# Patient Record
Sex: Male | Born: 2000 | Hispanic: No | Marital: Single | Smoking: Never smoker
Health system: Southern US, Community
[De-identification: ages and names within clinical notes are randomized; demographics above are authoritative.]

## PROBLEM LIST (undated history)

## (undated) DIAGNOSIS — J45909 Unspecified asthma, uncomplicated: Secondary | ICD-10-CM

## (undated) DIAGNOSIS — K9041 Non-celiac gluten sensitivity: Secondary | ICD-10-CM

## (undated) DIAGNOSIS — E079 Disorder of thyroid, unspecified: Secondary | ICD-10-CM

## (undated) DIAGNOSIS — J302 Other seasonal allergic rhinitis: Secondary | ICD-10-CM

## (undated) DIAGNOSIS — T7840XA Allergy, unspecified, initial encounter: Secondary | ICD-10-CM

## (undated) DIAGNOSIS — E063 Autoimmune thyroiditis: Secondary | ICD-10-CM

## (undated) HISTORY — DX: Non-celiac gluten sensitivity: K90.41

## (undated) HISTORY — DX: Allergy, unspecified, initial encounter: T78.40XA

## (undated) HISTORY — DX: Disorder of thyroid, unspecified: E07.9

## (undated) HISTORY — DX: Other seasonal allergic rhinitis: J30.2

## (undated) HISTORY — DX: Autoimmune thyroiditis: E06.3

## (undated) HISTORY — DX: Unspecified asthma, uncomplicated: J45.909

---
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
## 2021-01-30 ENCOUNTER — Encounter: Payer: Self-pay | Admitting: Family Medicine

## 2021-01-30 ENCOUNTER — Telehealth (INDEPENDENT_AMBULATORY_CARE_PROVIDER_SITE_OTHER): Payer: 59 | Admitting: Family Medicine

## 2021-01-30 VITALS — Wt 180.0 lb

## 2021-01-30 DIAGNOSIS — B349 Viral infection, unspecified: Secondary | ICD-10-CM | POA: Diagnosis not present

## 2021-01-30 NOTE — Progress Notes (Signed)
   Subjective:    Patient ID: Alec Montes, male    DOB: 10-30-2001, 20 y.o.   MRN: [id number]  HPI Virtual Visit via Telephone Note  I connected with the patient on 01/30/21 at 10:00 AM EDT by telephone and verified that I am speaking with the correct person using two identifiers.   I discussed the limitations, risks, security and privacy concerns of performing an evaluation and management service by telephone and the availability of in person appointments. I also discussed with the patient that there Montes be a patient responsible charge related to this service. The patient expressed understanding and agreed to proceed.  Location patient: home Location provider: work or home office Participants present for the call: patient, provider Patient did not have a visit in the prior 7 days to address this/these issue(s).   History of Present Illness: Here for 3 days of body aches, headache, ST, and a dry cough. No fever or NVD. No loss of taste or smell. He tested negative for the Covid virus yesterday. He is drinking fluids and taking Tylenol Cold and Flu along with Ibuprofen. He feels better today than yesterday.    Observations/Objective: Patient sounds cheerful and well on the phone. I do not appreciate any SOB. Speech and thought processing are grossly intact. Patient reported vitals:  Assessment and Plan: Viral illness. He will continue the OTC measures he has been using. Recheck as needed.  Gershon Crane, MD   Follow Up Instructions:     [phone] [phone] 11-20 [id number] 21-30 I did not refer this patient for an OV in the next 24 hours for this/these issue(s).  I discussed the assessment and treatment plan with the patient. The patient was provided an opportunity to ask questions and all were answered. The patient agreed with the plan and demonstrated an understanding of the instructions.   The patient was advised to call back or seek an in-person evaluation if the symptoms worsen or if  the condition fails to improve as anticipated.  I provided 10 minutes of non-face-to-face time during this encounter.   Gershon Crane, MD    Review of Systems     Objective:   Physical Exam        Assessment & Plan:

## 2021-06-02 ENCOUNTER — Encounter: Payer: 59 | Admitting: Family Medicine

## 2021-06-14 ENCOUNTER — Other Ambulatory Visit: Payer: Self-pay

## 2021-06-14 ENCOUNTER — Encounter: Payer: Self-pay | Admitting: Family Medicine

## 2021-06-14 ENCOUNTER — Ambulatory Visit (INDEPENDENT_AMBULATORY_CARE_PROVIDER_SITE_OTHER): Payer: 59 | Admitting: Family Medicine

## 2021-06-14 VITALS — BP 128/60 | HR 67 | Temp 98.3°F | Ht 72.3 in | Wt 185.5 lb

## 2021-06-14 DIAGNOSIS — R5383 Other fatigue: Secondary | ICD-10-CM | POA: Diagnosis not present

## 2021-06-14 DIAGNOSIS — Z0000 Encounter for general adult medical examination without abnormal findings: Secondary | ICD-10-CM

## 2021-06-14 LAB — CBC WITH DIFFERENTIAL/PLATELET
Basophils Absolute: 0.1 10*3/uL (ref 0.0–0.1)
Basophils Relative: 0.6 % (ref 0.0–3.0)
Eosinophils Absolute: 0.1 10*3/uL (ref 0.0–0.7)
Eosinophils Relative: 1.1 % (ref 0.0–5.0)
HCT: 42.7 % (ref 36.0–49.0)
Hemoglobin: 15 g/dL (ref 12.0–16.0)
Lymphocytes Relative: 31 % (ref 24.0–48.0)
Lymphs Abs: 2.8 10*3/uL (ref 0.7–4.0)
MCHC: 35.1 g/dL (ref 31.0–37.0)
MCV: 83.7 fl (ref 78.0–98.0)
Monocytes Absolute: 0.8 10*3/uL (ref 0.1–1.0)
Monocytes Relative: 9 % (ref 3.0–12.0)
Neutro Abs: 5.3 10*3/uL (ref 1.4–7.7)
Neutrophils Relative %: 58.3 % (ref 43.0–71.0)
Platelets: 323 10*3/uL (ref 150.0–575.0)
RBC: 5.1 Mil/uL (ref 3.80–5.70)
RDW: 12.3 % (ref 11.4–15.5)
WBC: 9.1 10*3/uL (ref 4.5–13.5)

## 2021-06-14 LAB — HEPATIC FUNCTION PANEL
ALT: 19 U/L (ref 0–53)
AST: 14 U/L (ref 0–37)
Albumin: 4.4 g/dL (ref 3.5–5.2)
Alkaline Phosphatase: 60 U/L (ref 52–171)
Bilirubin, Direct: 0.1 mg/dL (ref 0.0–0.3)
Total Bilirubin: 0.5 mg/dL (ref 0.2–1.2)
Total Protein: 7.6 g/dL (ref 6.0–8.3)

## 2021-06-14 LAB — BASIC METABOLIC PANEL
BUN: 14 mg/dL (ref 6–23)
CO2: 29 mEq/L (ref 19–32)
Calcium: 9.9 mg/dL (ref 8.4–10.5)
Chloride: 99 mEq/L (ref 96–112)
Creatinine, Ser: 0.77 mg/dL (ref 0.40–1.50)
GFR: 129.71 mL/min (ref >60.00)
Glucose, Bld: 83 mg/dL (ref 70–99)
Potassium: 4.3 mEq/L (ref 3.5–5.1)
Sodium: 138 mEq/L (ref 135–145)

## 2021-06-14 LAB — LIPID PANEL
Cholesterol: 119 mg/dL (ref 0–200)
HDL: 34 mg/dL — ABNORMAL LOW (ref >39.00)
LDL Cholesterol: 69 mg/dL (ref 0–99)
NonHDL: 85.26
Total CHOL/HDL Ratio: 4
Triglycerides: 80 mg/dL (ref 0.0–149.0)
VLDL: 16 mg/dL (ref 0.0–40.0)

## 2021-06-14 LAB — TESTOSTERONE: Testosterone: 563.13 ng/dL (ref 200.00–970.00)

## 2021-06-14 LAB — TSH: TSH: 2.38 u[IU]/mL (ref 0.40–5.00)

## 2021-06-14 NOTE — Progress Notes (Signed)
Established Patient Office Visit  Subjective:  Patient ID: Alec Montes, male    DOB: [birth year]  Age: 20 y.o. MRN: [id number]  CC:  Chief Complaint  Patient presents with   Annual Exam    No new concerns     HPI [name] presents for physical exam.  Generally very healthy.  Exercises regularly.  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  He is majoring in Warden/ranger. 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  He uses contact lenses.  Past history of thyroiditis.  No recent labs.  Does have occasional fatigue issues.  He is requesting testosterone level.  Normal libido.  -Tetanus due 2023 -Other immunizations reviewed.  No history of HPV vaccine and he declines.  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  He has a steady girlfriend.  Uses condoms for STD prevention.  Non-smoker.  No illicit drug use.  Family history-Brother with history of depression which is stable.  Mother's had breast cancer.  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  Family History  Problem Relation Age of Onset   GER disease Father    Depression Brother    GER disease Paternal Grandmother    Hypertension Paternal Grandmother    Hyperlipidemia Paternal Grandmother    Hypertension Paternal Aunt    Hyperthyroidism Paternal Aunt    Breast cancer Mother    Polo Riley sequence Sister    Alzheimer's disease Maternal Grandmother    Parkinson's disease Maternal Grandmother    Leukemia Paternal Grandfather    Prostate cancer Neg Hx    Colon cancer Neg Hx     Social History   Socioeconomic History   Marital status: Single    Spouse name: Not on file   Number of  children: Not on file   Years of education: Not on file   Highest education level: Not on file  Occupational History   Not on file  Tobacco Use   Smoking status: Never   Smokeless tobacco: Never  Vaping Use   Vaping Use: Never used  Substance and Sexual Activity   Alcohol use: No   Drug use: No   Sexual activity: Never  Other Topics Concern   Not on file  Social History Narrative   Not on file   Social Determinants of Health   Financial Resource Strain: Not on file  Food Insecurity: Not on file  Transportation Needs: Not on file  Physical Activity: Not on file  Stress: Not on file  Social Connections: Not on file  Intimate Partner Violence: Not on file    Outpatient Medications Prior to Visit  Medication Sig Dispense Refill   albuterol (PROVENTIL HFA;VENTOLIN HFA) 108 (90 Base) MCG/ACT inhaler Inhale 2 puffs into the lungs every 6 (six) hours as needed for wheezing or shortness of breath. 1 Inhaler 0   cetirizine (ZYRTEC) 10 MG tablet Take 10 mg by mouth as needed for allergies.      Cholecalciferol 25 MCG (1000 UT) tablet Take 1,000 Units by mouth daily.     ferrous sulfate 325 (65 FE) MG tablet Take by mouth.     No facility-administered medications prior  to visit.    No Known Allergies  ROS Review of Systems  Constitutional:  Negative for activity change, appetite change, fatigue and fever.  HENT:  Negative for congestion, ear pain and trouble swallowing.   Eyes:  Negative for pain and visual disturbance.  Respiratory:  Negative for cough, shortness of breath and wheezing.   Cardiovascular:  Negative for chest pain and palpitations.  Gastrointestinal:  Negative for abdominal distention, abdominal pain, blood in stool, constipation, diarrhea, nausea, rectal pain and vomiting.  Genitourinary:  Negative for dysuria, hematuria and testicular pain.  Musculoskeletal:  Negative for arthralgias and joint swelling.  Skin:  Negative for rash.  Neurological:  Negative for  dizziness, syncope and headaches.  Hematological:  Negative for adenopathy.  Psychiatric/Behavioral:  Negative for confusion and dysphoric mood.      Objective:    Physical Exam Constitutional:      General: He is not in acute distress.    Appearance: He is well-developed.  HENT:     Head: Normocephalic and atraumatic.     Right Ear: External ear normal.     Left Ear: External ear normal.  Eyes:     Conjunctiva/sclera: Conjunctivae normal.     Pupils: Pupils are equal, round, and reactive to light.  Neck:     Thyroid: No thyromegaly.  Cardiovascular:     Rate and Rhythm: Normal rate and regular rhythm.     Heart sounds: Normal heart sounds. No murmur heard. Pulmonary:     Effort: No respiratory distress.     Breath sounds: No wheezing or rales.  Abdominal:     General: Bowel sounds are normal. There is no distension.     Palpations: Abdomen is soft. There is no mass.     Tenderness: There is no abdominal tenderness. There is no guarding or rebound.  Musculoskeletal:     Cervical back: Normal range of motion and neck supple.     Right lower leg: No edema.     Left lower leg: No edema.  Lymphadenopathy:     Cervical: No cervical adenopathy.  Skin:    Findings: No rash.     Comments: Skin is peeling upper arms and shoulders bilaterally from recent sunburn.  No concerning skin lesions noted.  Neurological:     Mental Status: He is alert and oriented to person, place, and time.     Cranial Nerves: No cranial nerve deficit.    BP 128/60 (BP Location: Left Arm, Patient Position: Sitting, Cuff Size: Normal)   Pulse 67   Temp 98.3 F (36.8 C) (Oral)   Ht 6' 0.3" (1.836 m)   Wt 185 lb 8 oz (84.1 kg)   SpO2 100%   BMI 24.95 kg/m  Wt Readings from Last 3 Encounters:  06/14/21 185 lb 8 oz (84.1 kg) (85 %, Z= 1.05)*  01/30/21 180 lb (81.6 kg) (82 %, Z= 0.93)*  06/01/20 194 lb 14.4 oz (88.4 kg) (92 %, Z= 1.40)*   * Growth percentiles are based on CDC (Boys, 2-20 Years)  data.     Health Maintenance Due  Topic Date Due   HPV VACCINES (1 - Male 2-dose series) Never done   HIV Screening  Never done   INFLUENZA VACCINE  06/07/2021       Topic Date Due   HPV VACCINES (1 - Male 2-dose series) Never done    Lab Results  Component Value Date   TSH 3.453 04/17/2015   Lab Results  Component Value Date  WBC 7.7 11/04/2014   HGB 13.7 11/04/2014   HCT 38.7 11/04/2014   MCV 78.7 11/04/2014   PLT 323 11/04/2014   No results found for: NA, K, CHLORIDE, CO2, GLUCOSE, BUN, CREATININE, BILITOT, ALKPHOS, AST, ALT, PROT, ALBUMIN, CALCIUM, ANIONGAP, EGFR, GFR No results found for: CHOL No results found for: HDL No results found for: LDLCALC No results found for: TRIG No results found for: CHOLHDL No results found for: HGBA1C    Assessment & Plan:   Problem List Items Addressed This Visit   None Visit Diagnoses     Physical exam    -  Primary   Relevant Orders   Basic metabolic panel   Lipid panel   CBC with Differential/Platelet   TSH   Hepatic function panel   Testosterone   Fatigue, unspecified type       Relevant Orders   Testosterone     Generally healthy 20 year old male.  No active medical problems.  We discussed the following health maintenance issues  -Discussed STD prevention.  He states he is using condoms consistently at all times. -Discussed HPV vaccine but he declines -Obtain screening labs as above.  Patient requesting testosterone level.  Has had occasional fatigue issues but generally good exercise tolerance.  No orders of the defined types were placed in this encounter.   Follow-up: No follow-ups on file.    Carolann Littler, MD

## 2022-01-18 ENCOUNTER — Ambulatory Visit: Payer: 59 | Admitting: Cardiology

## 2022-01-18 ENCOUNTER — Encounter: Payer: Self-pay | Admitting: Cardiology

## 2022-01-18 ENCOUNTER — Other Ambulatory Visit: Payer: Self-pay

## 2022-01-18 VITALS — BP 130/80 | HR 55 | Temp 98.3°F | Resp 16 | Ht 72.0 in | Wt 189.8 lb

## 2022-01-18 DIAGNOSIS — J302 Other seasonal allergic rhinitis: Secondary | ICD-10-CM

## 2022-01-18 NOTE — Progress Notes (Signed)
? ?Primary Physician/Referring:  Eulas Post, MD ? ?Patient ID: Alec Montes, male    DOB: 24-Jul-2001, 21 y.o.   MRN: [id number] ? ?No chief complaint on file. ? ?HPI:   ? ?Alec Montes  is a 21 y.o. male patient with no significant cardiovascular history, has seasonal allergies, recently had noticed dyspnea and nasal congestion.  He works out fairly heavily without any chest pain or dyspnea.  ? ?Past Medical History:  ?Diagnosis Date  ? Allergic rhinitis, seasonal 11/30/2017  ? Allergy   ? Asthma   ? Gluten intolerance 11/30/2017  ? 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 Thyroid disease   ? ?History reviewed. No pertinent surgical history. ?Family History  ?Problem Relation Age of Onset  ? Breast cancer Mother   ? GER disease Father   ? Polo Riley sequence Sister   ? Depression Brother   ? Hypertension Paternal Aunt   ? Hyperthyroidism Paternal Aunt   ? Alzheimer's disease Maternal Grandmother   ? Parkinson's disease Maternal Grandmother   ? GER disease Paternal Grandmother   ? Hypertension Paternal Grandmother   ? Hyperlipidemia Paternal Grandmother   ? Leukemia Paternal Grandfather   ? Prostate cancer Neg Hx   ? Colon cancer Neg Hx   ?  ?Social History  ? ?Tobacco Use  ? Smoking status: Never  ? Smokeless tobacco: Never  ?Substance Use Topics  ? Alcohol use: No  ? ?Marital Status: Single  ?ROS  ?Review of Systems  ?HENT:  Positive for congestion.   ?Cardiovascular:  Negative for chest pain, dyspnea on exertion and leg swelling.  ?Objective  ?Blood pressure (!) 165/83, pulse (!) 55, temperature 98.3 ?F (36.8 ?C), temperature source Temporal, resp. rate 16, height 6' (1.829 m), weight 189 lb 12.8 oz (86.1 kg), SpO2 100 %. Body mass index is 25.74 kg/m?.  ?Vitals with BMI 01/18/2022 06/14/2021 01/30/2021  ?Height 6\' 0"  6' 0.3" -  ?Weight 189 lbs 13 oz 185 lbs 8 oz 180 lbs  ?BMI 25.74 24.96 -  ?Systolic [id number] 0000000 -  ?Diastolic 83 60 -  ?Pulse 55 67 -  ?  ?Physical Exam ?Neck:  ?   Vascular: No JVD.   ?Cardiovascular:  ?   Rate and Rhythm: Normal rate and regular rhythm.  ?   Pulses: Intact distal pulses.  ?   Heart sounds: Normal heart sounds. No murmur heard. ?  No gallop.  ?Pulmonary:  ?   Effort: Pulmonary effort is normal.  ?   Breath sounds: Normal breath sounds.  ?Abdominal:  ?   General: Bowel sounds are normal.  ?   Palpations: Abdomen is soft.  ?Musculoskeletal:  ?   Right lower leg: No edema.  ?   Left lower leg: No edema.  ?  ? ?Medications and allergies  ?No Known Allergies  ? ?Medication list after today's encounter  ? ?Current Outpatient Medications:  ?  albuterol (PROVENTIL HFA;VENTOLIN HFA) 108 (90 Base) MCG/ACT inhaler, Inhale 2 puffs into the lungs every 6 (six) hours as needed for wheezing or shortness of breath., Disp: 1 Inhaler, Rfl: 0 ?  cetirizine (ZYRTEC) 10 MG tablet, Take 10 mg by mouth as needed for allergies. , Disp: , Rfl:  ?  Cholecalciferol 25 MCG (1000 UT) tablet, Take 1,000 Units by mouth daily., Disp: , Rfl:  ?  finasteride (PROPECIA) 1 MG tablet, Take 1 mg by mouth daily., Disp: , Rfl:  ? ?Laboratory examination:  ? ?Recent Labs  ?  06/14/21 ?1008  ?NA  138  ?K 4.3  ?CL 99  ?CO2 29  ?GLUCOSE 83  ?BUN 14  ?CREATININE 0.77  ?CALCIUM 9.9  ? ?CrCl cannot be calculated (Patient's most recent lab result is older than the maximum 21 days allowed.).  ?CMP Latest Ref Rng & Units 06/14/2021  ?Glucose 70 - 99 mg/dL 83  ?BUN 6 - 23 mg/dL 14  ?Creatinine 0.40 - 1.50 mg/dL 0.77  ?Sodium 135 - 145 mEq/L 138  ?Potassium 3.5 - 5.1 mEq/L 4.3  ?Chloride 96 - 112 mEq/L 99  ?CO2 19 - 32 mEq/L 29  ?Calcium 8.4 - 10.5 mg/dL 9.9  ?Total Protein 6.0 - 8.3 g/dL 7.6  ?Total Bilirubin 0.2 - 1.2 mg/dL 0.5  ?Alkaline Phos 52 - 171 U/L 60  ?AST 0 - 37 U/L 14  ?ALT 0 - 53 U/L 19  ? ?CBC Latest Ref Rng & Units 06/14/2021 11/04/2014  ?WBC 4.5 - 13.5 K/uL 9.1 7.7  ?Hemoglobin 12.0 - 16.0 g/dL 15.0 13.7  ?Hematocrit 36.0 - 49.0 % 42.7 38.7  ?Platelets 150.0 - 575.0 K/uL 323.0 323  ? ? ?Lipid Panel ?Recent Labs  ?   06/14/21 ?1008  ?CHOL 119  ?TRIG 80.0  ?[place] 69  ?VLDL 16.0  ?HDL 34.00*  ?CHOLHDL 4  ? ?HEMOGLOBIN A1C ?No results found for: HGBA1C, MPG ?TSH ?Recent Labs  ?  06/14/21 ?1008  ?TSH 2.38  ? ?Radiology:  ? ? ?Cardiac Studies:  ?NA ? ?EKG:  ? ?EKG 01/18/2022: Normal sinus rhythm with sinus arrhythmia at rate of 60 bpm, early repolarization.  Increased voltage within normal limits for age.  Normal EKG.   ? ?Assessment  ? ?  ICD-10-CM   ?1. Dyspnea on exertion  R06.09 EKG 12-Lead  ?  ?  ? ?Medications Discontinued During This Encounter  ?Medication Reason  ? ferrous sulfate 325 (65 FE) MG tablet   ?  ?No orders of the defined types were placed in this encounter. ? ?Orders Placed This Encounter  ?Procedures  ? EKG 12-Lead  ? ?Recommendations:  ? ?Alec Montes is a 21 y.o. male patient with no significant cardiovascular history, has seasonal allergies, recently had noticed dyspnea and nasal congestion.  He works out fairly heavily without any chest pain or dyspnea.  Physical examination and EKG are normal.  I do not think he has any cardiac etiology for his symptoms, clearly has ongoing allergies for many years, as a child he was taking allergy shots. ? ?Advised him to consider starting Singulair and to discuss with his PCP prior to onset of spring and fall.  No cardiac evaluation is indicated.  I simply reassured him. ? ?When he presented initially his blood pressure was elevated, I have discussed with him to keep an eye on this and to watch out for his salt intake.  During exercise he does use preexercise caffeinated drinks that could be contributing to his presentation. ? ?Reviewed his labs, HDL is low for age, suspect this is hereditary.  LDL is at goal.  Discussed tobacco use, nicotine exposure, diet as primary preventive issues.  I will see him back on a as needed basis. ? ? ? ?Adrian Prows, MD, [facility] ?01/18/2022, 3:46 PM ?Office: [phone] ?

## 2023-12-14 ENCOUNTER — Telehealth: Payer: Self-pay | Admitting: Family Medicine
